# Patient Record
Sex: Male | Born: 1995
Health system: Southern US, Community
[De-identification: ages and names within clinical notes are randomized; demographics above are authoritative.]

## PROBLEM LIST (undated history)

## (undated) HISTORY — PX: TONSILLECTOMY: SUR1361

---
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
## 2022-12-18 DEATH — deceased
